# Patient Record
Sex: Male | Born: 1985 | Hispanic: Yes | Marital: Single
Health system: Southern US, Community
[De-identification: ages and names within clinical notes are randomized; demographics above are authoritative.]

---

## 2021-01-05 ENCOUNTER — Other Ambulatory Visit: Payer: Self-pay

## 2021-01-05 ENCOUNTER — Emergency Department
Admission: EM | Admit: 2021-01-05 | Discharge: 2021-01-05 | Disposition: A | Discharge status: Home / Self Care | Payer: No Typology Code available for payment source | Attending: Emergency Medicine | Admitting: Emergency Medicine

## 2021-01-05 ENCOUNTER — Emergency Department: Payer: No Typology Code available for payment source

## 2021-01-05 DIAGNOSIS — S161XXA Strain of muscle, fascia and tendon at neck level, initial encounter: Secondary | ICD-10-CM | POA: Insufficient documentation

## 2021-01-05 DIAGNOSIS — S39012A Strain of muscle, fascia and tendon of lower back, initial encounter: Secondary | ICD-10-CM | POA: Insufficient documentation

## 2021-01-05 DIAGNOSIS — S3992XA Unspecified injury of lower back, initial encounter: Secondary | ICD-10-CM | POA: Diagnosis present

## 2021-01-05 DIAGNOSIS — Y92410 Unspecified street and highway as the place of occurrence of the external cause: Secondary | ICD-10-CM | POA: Insufficient documentation

## 2021-01-05 MED ORDER — METHOCARBAMOL 500 MG PO TABS: 500.0000 mg | ORAL_TABLET | Freq: Four times a day (QID) | ORAL | 0 refills | Status: AC

## 2021-01-05 MED ORDER — METHOCARBAMOL 500 MG PO TABS
1000.0000 mg | ORAL_TABLET | Freq: Once | ORAL | Status: AC
  Administered 2021-01-05: 1000 mg via ORAL
  Filled 2021-01-05: qty 2

## 2021-01-05 MED ORDER — MELOXICAM 15 MG PO TABS: 15.0000 mg | ORAL_TABLET | Freq: Every day | ORAL | 0 refills | Status: AC

## 2021-01-05 MED ORDER — MELOXICAM 7.5 MG PO TABS
15.0000 mg | ORAL_TABLET | Freq: Once | ORAL | Status: AC
  Administered 2021-01-05: 15 mg via ORAL
  Filled 2021-01-05: qty 2
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
